# Patient Record
Sex: Female | Born: 1995 | Hispanic: No | Marital: Single | Smoking: Never smoker
Health system: Southern US, Community
[De-identification: ages and names within clinical notes are randomized; demographics above are authoritative.]

## PROBLEM LIST (undated history)

## (undated) HISTORY — PX: APPENDECTOMY: SHX54

## (undated) HISTORY — PX: HERNIA REPAIR: SHX51

---

## 2014-04-14 ENCOUNTER — Encounter (HOSPITAL_COMMUNITY): Payer: Self-pay | Admitting: Emergency Medicine

## 2014-04-14 ENCOUNTER — Emergency Department (HOSPITAL_COMMUNITY)
Admission: EM | Admit: 2014-04-14 | Discharge: 2014-04-15 | Disposition: A | Discharge status: Home / Self Care | Payer: Federal, State, Local not specified - PPO | Attending: Emergency Medicine | Admitting: Emergency Medicine

## 2014-04-14 ENCOUNTER — Emergency Department (HOSPITAL_COMMUNITY)
Admission: EM | Admit: 2014-04-14 | Discharge: 2014-04-14 | Disposition: A | Discharge status: Home / Self Care | Payer: Federal, State, Local not specified - PPO | Source: Home / Self Care | Attending: Family Medicine | Admitting: Family Medicine

## 2014-04-14 DIAGNOSIS — T50995A Adverse effect of other drugs, medicaments and biological substances, initial encounter: Secondary | ICD-10-CM | POA: Insufficient documentation

## 2014-04-14 DIAGNOSIS — N390 Urinary tract infection, site not specified: Secondary | ICD-10-CM

## 2014-04-14 DIAGNOSIS — R0682 Tachypnea, not elsewhere classified: Secondary | ICD-10-CM | POA: Diagnosis not present

## 2014-04-14 DIAGNOSIS — R0789 Other chest pain: Secondary | ICD-10-CM | POA: Diagnosis not present

## 2014-04-14 DIAGNOSIS — R000 Tachycardia, unspecified: Secondary | ICD-10-CM | POA: Diagnosis not present

## 2014-04-14 DIAGNOSIS — Z888 Allergy status to other drugs, medicaments and biological substances status: Secondary | ICD-10-CM | POA: Insufficient documentation

## 2014-04-14 DIAGNOSIS — J029 Acute pharyngitis, unspecified: Secondary | ICD-10-CM | POA: Insufficient documentation

## 2014-04-14 DIAGNOSIS — R252 Cramp and spasm: Secondary | ICD-10-CM | POA: Diagnosis not present

## 2014-04-14 DIAGNOSIS — Z880 Allergy status to penicillin: Secondary | ICD-10-CM | POA: Diagnosis not present

## 2014-04-14 DIAGNOSIS — F411 Generalized anxiety disorder: Secondary | ICD-10-CM | POA: Diagnosis not present

## 2014-04-14 DIAGNOSIS — T7840XA Allergy, unspecified, initial encounter: Secondary | ICD-10-CM

## 2014-04-14 LAB — POCT URINALYSIS DIP (DEVICE)
GLUCOSE, UA: 500 mg/dL — AB
Ketones, ur: 15 mg/dL — AB
Nitrite: POSITIVE — AB
Specific Gravity, Urine: 1.01 (ref 1.005–1.030)
Urobilinogen, UA: >=8 mg/dL (ref 0.0–1.0)
pH: 5 (ref 5.0–8.0)

## 2014-04-14 LAB — BASIC METABOLIC PANEL
Anion gap: 13 (ref 5–15)
BUN: 13 mg/dL (ref 6–23)
CO2: 26 mEq/L (ref 19–32)
Calcium: 9.3 mg/dL (ref 8.4–10.5)
Chloride: 101 mEq/L (ref 96–112)
Creatinine, Ser: 0.74 mg/dL (ref 0.50–1.10)
GFR calc Af Amer: >90 mL/min (ref >90)
GFR calc non Af Amer: >90 mL/min (ref >90)
Glucose, Bld: 100 mg/dL — ABNORMAL HIGH (ref 70–99)
Potassium: 3.4 mEq/L — ABNORMAL LOW (ref 3.7–5.3)
Sodium: 140 mEq/L (ref 137–147)

## 2014-04-14 LAB — CBC
HCT: 39.6 % (ref 36.0–46.0)
Hemoglobin: 13.8 g/dL (ref 12.0–15.0)
MCH: 32 pg (ref 26.0–34.0)
MCHC: 34.8 g/dL (ref 30.0–36.0)
MCV: 91.9 fL (ref 78.0–100.0)
Platelets: 189 10*3/uL (ref 150–400)
RBC: 4.31 MIL/uL (ref 3.87–5.11)
RDW: 11.7 % (ref 11.5–15.5)
WBC: 8.3 10*3/uL (ref 4.0–10.5)

## 2014-04-14 LAB — POCT PREGNANCY, URINE: Preg Test, Ur: NEGATIVE

## 2014-04-14 MED ORDER — PREDNISONE 20 MG PO TABS
60.0000 mg | ORAL_TABLET | Freq: Once | ORAL | Status: AC
  Administered 2014-04-14: 60 mg via ORAL
  Filled 2014-04-14: qty 3

## 2014-04-14 MED ORDER — DEXTROSE 5 % IV SOLN
1.0000 g | Freq: Once | INTRAVENOUS | Status: AC
  Administered 2014-04-14: 1 g via INTRAVENOUS
  Filled 2014-04-14: qty 10

## 2014-04-14 MED ORDER — DIPHENHYDRAMINE HCL 50 MG/ML IJ SOLN: 25.0000 mg | Freq: Once | INTRAMUSCULAR | Status: DC

## 2014-04-14 MED ORDER — DIPHENHYDRAMINE HCL 25 MG PO CAPS
25.0000 mg | ORAL_CAPSULE | Freq: Once | ORAL | Status: AC
  Administered 2014-04-14: 25 mg via ORAL
  Filled 2014-04-14: qty 1

## 2014-04-14 MED ORDER — SULFAMETHOXAZOLE-TRIMETHOPRIM 800-160 MG PO TABS: 1.0000 | ORAL_TABLET | Freq: Two times a day (BID) | ORAL | Status: DC

## 2014-04-14 MED ORDER — ONDANSETRON HCL 4 MG/2ML IJ SOLN
4.0000 mg | Freq: Once | INTRAMUSCULAR | Status: AC
  Administered 2014-04-14: 4 mg via INTRAVENOUS
  Filled 2014-04-14: qty 2

## 2014-04-14 MED ORDER — KETOROLAC TROMETHAMINE 30 MG/ML IJ SOLN
30.0000 mg | Freq: Once | INTRAMUSCULAR | Status: AC
  Administered 2014-04-14: 30 mg via INTRAVENOUS
  Filled 2014-04-14: qty 1

## 2014-04-14 MED ORDER — SODIUM CHLORIDE 0.9 % IV BOLUS (SEPSIS)
1000.0000 mL | Freq: Once | INTRAVENOUS | Status: AC
  Administered 2014-04-14: 1000 mL via INTRAVENOUS

## 2014-04-14 MED ORDER — ONDANSETRON 4 MG PO TBDP: 4.0000 mg | ORAL_TABLET | Freq: Once | ORAL | Status: DC

## 2014-04-14 MED ORDER — ACETAMINOPHEN 500 MG PO TABS
1000.0000 mg | ORAL_TABLET | Freq: Once | ORAL | Status: AC
  Administered 2014-04-14: 1000 mg via ORAL
  Filled 2014-04-14: qty 2

## 2014-04-14 NOTE — ED Provider Notes (Signed)
Medical screening examination/treatment/procedure(s) were performed by resident physician or non-physician practitioner and as supervising physician I was immediately available for consultation/collaboration.   KINDL,JAMES DOUGLAS MD.   James D Kindl, MD 04/14/14 1709 

## 2014-04-14 NOTE — ED Notes (Signed)
Pt went to [facility] for UTI and was given Bactrim for treatment.  Pt reports she is allergic to Bactrim but "forgot to tell them".  Pt took first dose of medication at 8pm tonight- currently complaining of chest tightness, difficulty breathing, difficulty swallowing, and muscle cramping.  Pt speaking in full sentences at present, airway intact- respirations even and unlabored at present.

## 2014-04-14 NOTE — ED Provider Notes (Signed)
CSN: [id number]     Arrival date & time 04/14/14  2212 History   First MD Initiated Contact with Patient 04/14/14 2301     Chief Complaint  Patient presents with  . Allergic Reaction     (Consider location/radiation/quality/duration/timing/severity/associated sxs/prior Treatment) HPI Pt is a 18yo female presenting to ED with c/o allergic reaction to bactrim she took around 8pm this evening after being dx with a UTI earlier today at urgent care. Pt states she woke this morning with urinary frequency, urgency, and dysuria w/o hematuria.  States she has had UTIs in past and states "I know what they feel like"  Reports mild nausea but no vomiting. States she forgot to tell urgent care she is allergic to bactrim.  Since taking medication she has developed chills, bilateral lower back pain, feels like her tongue and throat are swelling as well as having difficulty swallowing with chest tightness.  Lower back pain is cramping, 5/10, constant since onset a little after 8pm this evening.  In triage, pt is speaking in full sentences, respirations were even and unlabored.  Denies hives or rash.  Denies cough, congestion, vaginal symptoms, or symptoms prior to this morning. Reports taking Azogantrisin at home which does help minimally with dysuria.  Denies hx of renal stones.   History reviewed. No pertinent past medical history. History reviewed. No pertinent past surgical history. No family history on file. History  Substance Use Topics  . Smoking status: Never Smoker   . Smokeless tobacco: Not on file  . Alcohol Use: No   OB History   Grav Para Term Preterm Abortions TAB SAB Ect Mult Living                 Review of Systems  Constitutional: Positive for fever and chills. Negative for appetite change.  HENT: Positive for sore throat ( feels like her throat and tongue are swelling) and trouble swallowing. Negative for drooling and voice change.   Respiratory: Positive for chest tightness and  shortness of breath. Negative for cough.   Cardiovascular: Negative for chest pain and palpitations.  Gastrointestinal: Positive for nausea. Negative for vomiting, abdominal pain and diarrhea.  Genitourinary: Positive for dysuria, urgency and frequency. Negative for hematuria and flank pain.  Musculoskeletal: Positive for back pain ( lower) and myalgias.  All other systems reviewed and are negative.     Allergies  Bactrim and Penicillins  Home Medications   Prior to Admission medications   Medication Sig Start Date End Date Taking? Authorizing Provider  phenazopyridine (PYRIDIUM) 95 MG tablet Take 190 mg by mouth 3 (three) times daily as needed for pain.   Yes Historical Provider, MD  sulfamethoxazole-trimethoprim (SEPTRA DS) 800-160 MG per tablet Take 1 tablet by mouth 2 (two) times daily. 04/14/14  Yes Roma Kayser Schorr, NP  cephALEXin (KEFLEX) 500 MG capsule Take 1 capsule (500 mg total) by mouth 2 (two) times daily. 04/15/14   Junius Finner, PA-C  ondansetron (ZOFRAN) 4 MG tablet Take 1 tablet (4 mg total) by mouth every 6 (six) hours. 04/15/14   Junius Finner, PA-C   BP 95/52  Pulse 72  Temp(Src) 100.1 F (37.8 C) (Oral)  Resp 16  Ht  (1.6 m)  Wt 130 lb (58.968 kg)  BMI 23.03 kg/m2  SpO2 100%  LMP 03/31/2014 Physical Exam  Nursing note and vitals reviewed. Constitutional: She appears well-developed and well-nourished.  Pt lying in exam bed, shaking with chills, appears acutely ill  HENT:  Head: Normocephalic and  atraumatic.  Nose: Nose normal.  Mouth/Throat: Uvula is midline, oropharynx is clear and moist and mucous membranes are normal. No uvula swelling.  Oropharynx clear, no angio edema, tongue swelling to tonsillar edema. Airway in tact.  Eyes: Conjunctivae are normal. No scleral icterus.  Neck: Normal range of motion. Neck supple.  Cardiovascular: Regular rhythm and normal heart sounds.  Tachycardia present.   Pt tachycardic during physical exam but was not in  triage.   Pulmonary/Chest: Breath sounds normal. Tachypnea noted. No respiratory distress. She has no wheezes. She has no rales. She exhibits no tenderness.  tachypneic during exam but not in triage. No respiratory distress. Able to speak in full sentences, no wheeze.  Lungs: CTAB.  Abdominal: Soft. Bowel sounds are normal. She exhibits no distension and no mass. There is no tenderness. There is no rebound and no guarding.  Soft, non-distended, non-tender. No CVAT  Musculoskeletal: Normal range of motion.  Neurological: She is alert.  Skin: Skin is warm and dry.  Psychiatric: Her mood appears anxious.    ED Course  Procedures (including critical care time) Labs Review Labs Reviewed  BASIC METABOLIC PANEL - Abnormal; Notable for the following:    Potassium 3.4 (*)    Glucose, Bld 100 (*)    All other components within normal limits  URINALYSIS, ROUTINE W REFLEX MICROSCOPIC - Abnormal; Notable for the following:    Color, Urine ORANGE (*)    APPearance CLOUDY (*)    Ketones, ur 15 (*)    Nitrite POSITIVE (*)    Leukocytes, UA SMALL (*)    All other components within normal limits  URINE CULTURE  CBC  TROPONIN I  URINE MICROSCOPIC-ADD ON  I-STAT CG4 LACTIC ACID, ED    Imaging Review No results found.   EKG Interpretation   Date/Time:  Sunday April 15 2014 01:29:44 EDT Ventricular Rate:  108 PR Interval:  125 QRS Duration: 99 QT Interval:  329 QTC Calculation: 441 R Axis:   87 Text Interpretation:  Sinus tachycardia Borderline Q waves in lateral  leads Borderline repolarization abnormality T wave inversion in the  inferior and lateral leads - unchanged from today's prior EKG Confirmed by  Rhunette Croft, MD, ANKIT [phone]) on 04/15/2014 3:57:19 AM      MDM   Final diagnoses:  Allergic reaction, initial encounter  Chest tightness  Muscle cramps  UTI (lower urinary tract infection)   Pt is a 18yo female dx with UTI this morning at urgent care, presenting to ED with  c/o allergic reaction to bactrim she took at [facility] for her UTI.  C/o chest tightness, throat swelling, and lower back muscle cramping.  Pt does not appear to be in respiratory distress, O2 is 99% on room air, lungs: CTAB.  Pt is shaking with chills. No evidence of hives. No angioedema or tongue swelling appreciated on exam.  Pt given PO benadryl and prednisone.  HR in triage was 88, however, once pt in exam room, HR increased to 113, resp 28, pt started on IV fluids, given zofran and toradol.    11:31 PM pt states swelling has improved after given prednisone, benadryl, zofran and IV fluids started.    12:30 AM BP dropped to 85/47 after given 1L IV fluids. Pt states her breathing feels better but she still has mild chest tightness with deep inspiration, otherwise, feels well.  Discussed pt with Dr. Rhunette Croft who also examined pt, will give another 1L IV fluids, if pressure does not improve, will give IM Epi.  Will also obtain UA for culture as UTI appears complicated at this point.  IV rocephin given while pt in ED.    BP did improve, pt states she is feeling better and has been asking for crackers and something to drink. Pt was able to keep down several ounces of PO fluids and crackers.  Pt was observed in ED w/o evidence of evolving allergic reaction such as anaphylaxis.     Pt may be discharged home with 10 day course of keflex and zofran. Return precautions provided. Pt verbalized understanding and agreement with tx plan.  Discussed pt with Dr. Rhunette Croft throughout her ED stay.   Junius Finner, PA-C 04/15/14 2009

## 2014-04-14 NOTE — ED Provider Notes (Signed)
CSN: [id number]     Arrival date & time 04/14/14  1253 History   First MD Initiated Contact with Patient 04/14/14 1354     Chief Complaint  Patient presents with  . Urinary Tract Infection   Patient is a 18 y.o. female presenting with urinary tract infection. The history is provided by the patient.  Urinary Tract Infection This is a new problem. The current episode started 12 to 24 hours ago. The problem occurs constantly. The problem has been gradually worsening. Pertinent negatives include no chest pain, no abdominal pain, no headaches and no shortness of breath.  Pt reports frequency, urgency and dysuria since she awoke this am. She has had UTI's in thwe past and sx's are similar. Pt was able to palliate her discomfort with OTC Azogantrisin. Denies fever, abd or flank pain.   History reviewed. No pertinent past medical history. History reviewed. No pertinent past surgical history. No family history on file. History  Substance Use Topics  . Smoking status: Never Smoker   . Smokeless tobacco: Not on file  . Alcohol Use: No   OB History   Grav Para Term Preterm Abortions TAB SAB Ect Mult Living                 Review of Systems  Respiratory: Negative for shortness of breath.   Cardiovascular: Negative for chest pain.  Gastrointestinal: Negative for abdominal pain.  Neurological: Negative for headaches.  All other systems reviewed and are negative.   Allergies  Review of patient's allergies indicates no known allergies.  Home Medications   Prior to Admission medications   Medication Sig Start Date End Date Taking? Authorizing Provider  sulfamethoxazole-trimethoprim (SEPTRA DS) 800-160 MG per tablet Take 1 tablet by mouth 2 (two) times daily. 04/14/14   Roma Kayser Kienan Doublin, NP   BP 115/50  Pulse 88  Temp(Src) 97.9 F (36.6 C) (Oral)  Resp 18  SpO2 99%  LMP 03/31/2014 Physical Exam  Constitutional: She is oriented to person, place, and time. She appears well-developed and  well-nourished.  HENT:  Head: Normocephalic and atraumatic.  Eyes: Conjunctivae are normal.  Cardiovascular: Normal rate.   Pulmonary/Chest: Effort normal.  Abdominal: Soft. She exhibits no distension. There is no CVA tenderness.  Musculoskeletal: Normal range of motion.  Neurological: She is alert and oriented to person, place, and time.  Skin: Skin is warm and dry.  Psychiatric: She has a normal mood and affect.    ED Course  Procedures (including critical care time) Labs Review Labs Reviewed  POCT URINALYSIS DIP (DEVICE) - Abnormal; Notable for the following:    Glucose, UA 500 (*)    Bilirubin Urine MODERATE (*)    Ketones, ur 15 (*)    Hgb urine dipstick LARGE (*)    Protein, ur >=300 (*)    Nitrite POSITIVE (*)    Leukocytes, UA LARGE (*)    All other components within normal limits  POCT PREGNANCY, URINE    Imaging Review No results found.   MDM   1. Acute UTI    Septra DS II PO BID x 5 days Continue Azo-gantrisin as needed for dysuria.    Leanne Chang, NP 04/14/14 1425

## 2014-04-14 NOTE — Discharge Instructions (Signed)

## 2014-04-14 NOTE — ED Notes (Signed)
C/o UTI sx onset today Sx include: urinary freq/urgency, dysuria, hematuria Denies f/v, chills, abd pain Taking OTC Azo's w/no relief Alert, no signs of acute distress.

## 2014-04-15 LAB — URINALYSIS, ROUTINE W REFLEX MICROSCOPIC
Bilirubin Urine: NEGATIVE
Glucose, UA: NEGATIVE mg/dL
Hgb urine dipstick: NEGATIVE
Ketones, ur: 15 mg/dL — AB
Nitrite: POSITIVE — AB
Protein, ur: NEGATIVE mg/dL
Specific Gravity, Urine: 1.017 (ref 1.005–1.030)
Urobilinogen, UA: 1 mg/dL (ref 0.0–1.0)
pH: 6.5 (ref 5.0–8.0)

## 2014-04-15 LAB — TROPONIN I: Troponin I: <0.3 ng/mL (ref <0.30)

## 2014-04-15 LAB — URINE MICROSCOPIC-ADD ON

## 2014-04-15 LAB — I-STAT CG4 LACTIC ACID, ED: Lactic Acid, Venous: 1.81 mmol/L (ref 0.5–2.2)

## 2014-04-15 MED ORDER — SODIUM CHLORIDE 0.9 % IV BOLUS (SEPSIS)
1000.0000 mL | Freq: Once | INTRAVENOUS | Status: AC
  Administered 2014-04-15: 1000 mL via INTRAVENOUS

## 2014-04-15 MED ORDER — ONDANSETRON HCL 4 MG PO TABS: 4.0000 mg | ORAL_TABLET | Freq: Four times a day (QID) | ORAL | Status: DC

## 2014-04-15 MED ORDER — CEPHALEXIN 500 MG PO CAPS: 500.0000 mg | ORAL_CAPSULE | Freq: Two times a day (BID) | ORAL | Status: DC

## 2014-04-15 NOTE — ED Notes (Signed)
Dr. Rhunette Croft and Denny Peon, PA-C at bedside due to patients drop in blood pressure.

## 2014-04-16 LAB — URINE CULTURE
Colony Count: NO GROWTH
Culture: NO GROWTH

## 2014-04-16 NOTE — ED Provider Notes (Signed)
Medical screening examination/treatment/procedure(s) were conducted as a shared visit with non-physician practitioner(s) and myself.  I personally evaluated the patient during the encounter.   EKG Interpretation   Date/Time:  Sunday April 15 2014 01:29:44 EDT Ventricular Rate:  108 PR Interval:  125 QRS Duration: 99 QT Interval:  329 QTC Calculation: 441 R Axis:   87 Text Interpretation:  Sinus tachycardia Borderline Q waves in lateral  leads Borderline repolarization abnormality T wave inversion in the  inferior and lateral leads - unchanged from today's prior EKG Confirmed by  Rhunette Croft, MD, Janey Genta [phone]) on 04/15/2014 3:57:19 AM      Pt comes in with allergic reaction to bactrim She has hx of allergy to it, but forgot about it, and took the pill this evening (UTI).She started having some chest tightness, and discomfort in her throat and difficulty swallowing. Pt given some allergy meds in the ER - and feels better. She has no stridor, drooling, oral mucosa swelling and no wheez. Pt had her BP in the 80s -  Which i think is just normal for her, but given the bactrim intake, we were aggressive and gave her iv fluid and monitored her closely. PO challenge passed. Switch to keflex and nausea meds given.  Derwood Kaplan, MD 04/16/14 0005

## 2014-05-25 ENCOUNTER — Encounter (HOSPITAL_COMMUNITY): Payer: Self-pay | Admitting: Emergency Medicine

## 2014-05-25 ENCOUNTER — Emergency Department (HOSPITAL_COMMUNITY)
Admission: EM | Admit: 2014-05-25 | Discharge: 2014-05-26 | Disposition: A | Discharge status: Home / Self Care | Payer: Federal, State, Local not specified - PPO | Attending: Emergency Medicine | Admitting: Emergency Medicine

## 2014-05-25 ENCOUNTER — Emergency Department (HOSPITAL_COMMUNITY): Payer: Federal, State, Local not specified - PPO

## 2014-05-25 DIAGNOSIS — Z880 Allergy status to penicillin: Secondary | ICD-10-CM | POA: Diagnosis not present

## 2014-05-25 DIAGNOSIS — S99921A Unspecified injury of right foot, initial encounter: Secondary | ICD-10-CM | POA: Diagnosis present

## 2014-05-25 DIAGNOSIS — Z23 Encounter for immunization: Secondary | ICD-10-CM | POA: Insufficient documentation

## 2014-05-25 DIAGNOSIS — Z792 Long term (current) use of antibiotics: Secondary | ICD-10-CM | POA: Diagnosis not present

## 2014-05-25 DIAGNOSIS — R52 Pain, unspecified: Secondary | ICD-10-CM

## 2014-05-25 DIAGNOSIS — Z79899 Other long term (current) drug therapy: Secondary | ICD-10-CM | POA: Insufficient documentation

## 2014-05-25 DIAGNOSIS — Y9289 Other specified places as the place of occurrence of the external cause: Secondary | ICD-10-CM | POA: Diagnosis not present

## 2014-05-25 DIAGNOSIS — Y9389 Activity, other specified: Secondary | ICD-10-CM | POA: Insufficient documentation

## 2014-05-25 DIAGNOSIS — S9031XA Contusion of right foot, initial encounter: Secondary | ICD-10-CM | POA: Diagnosis not present

## 2014-05-25 DIAGNOSIS — W500XXA Accidental hit or strike by another person, initial encounter: Secondary | ICD-10-CM | POA: Diagnosis not present

## 2014-05-25 MED ORDER — TETANUS-DIPHTH-ACELL PERTUSSIS 5-2.5-18.5 LF-MCG/0.5 IM SUSP
0.5000 mL | Freq: Once | INTRAMUSCULAR | Status: AC
  Administered 2014-05-26: 0.5 mL via INTRAMUSCULAR
  Filled 2014-05-25: qty 0.5
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
## 2014-05-26 MED ORDER — IBUPROFEN 800 MG PO TABS: 800.0000 mg | ORAL_TABLET | Freq: Three times a day (TID) | ORAL | Status: AC | PRN

## 2014-05-26 NOTE — ED Provider Notes (Signed)
Medical screening examination/treatment/procedure(s) were performed by non-physician practitioner and as supervising physician I was immediately available for consultation/collaboration.   EKG Interpretation None       Ivie Savitt M Collen Vincent, MD 05/26/14 0639 

## 2014-05-26 NOTE — Discharge Instructions (Signed)
Read the information below.  Use the prescribed medication as directed.  Please discuss all new medications with your pharmacist.  You may return to the Emergency Department at any time for worsening condition or any new symptoms that concern you.    If you develop uncontrolled pain, weakness or numbness of the extremity, severe discoloration of the skin, or you are unable to walk, return to the ER for a recheck.      Contusion A contusion is a deep bruise. Contusions happen when an injury causes bleeding under the skin. Signs of bruising include pain, puffiness (swelling), and discolored skin. The contusion may turn blue, purple, or yellow. HOME CARE   Put ice on the injured area.  Put ice in a plastic bag.  Place a towel between your skin and the bag.  Leave the ice on for 15-20 minutes, 03-04 times a day.  Only take medicine as told by your doctor.  Rest the injured area.  If possible, raise (elevate) the injured area to lessen puffiness. GET HELP RIGHT AWAY IF:   You have more bruising or puffiness.  You have pain that is getting worse.  Your puffiness or pain is not helped by medicine. MAKE SURE YOU:   Understand these instructions.  Will watch your condition.  Will get help right away if you are not doing well or get worse. Document Released: 12/30/2007 Document Revised: 10/05/2011 Document Reviewed: 05/18/2011 [facility] Patient Information 2015 [employer] [place]. This information is not intended to replace advice given to you by your health care provider. Make sure you discuss any questions you have with your health care provider.

## 2015-04-09 IMAGING — CR DG FOOT COMPLETE 3+V*R*
3 series · 3 of 3 positions shown · non-contrast
Comparison: None.

CLINICAL DATA: RIGHT foot was stepped on yesterday, bruising across
mid and forefoot.

EXAM:
RIGHT FOOT COMPLETE - 3+ VIEW

[t foot ap right]
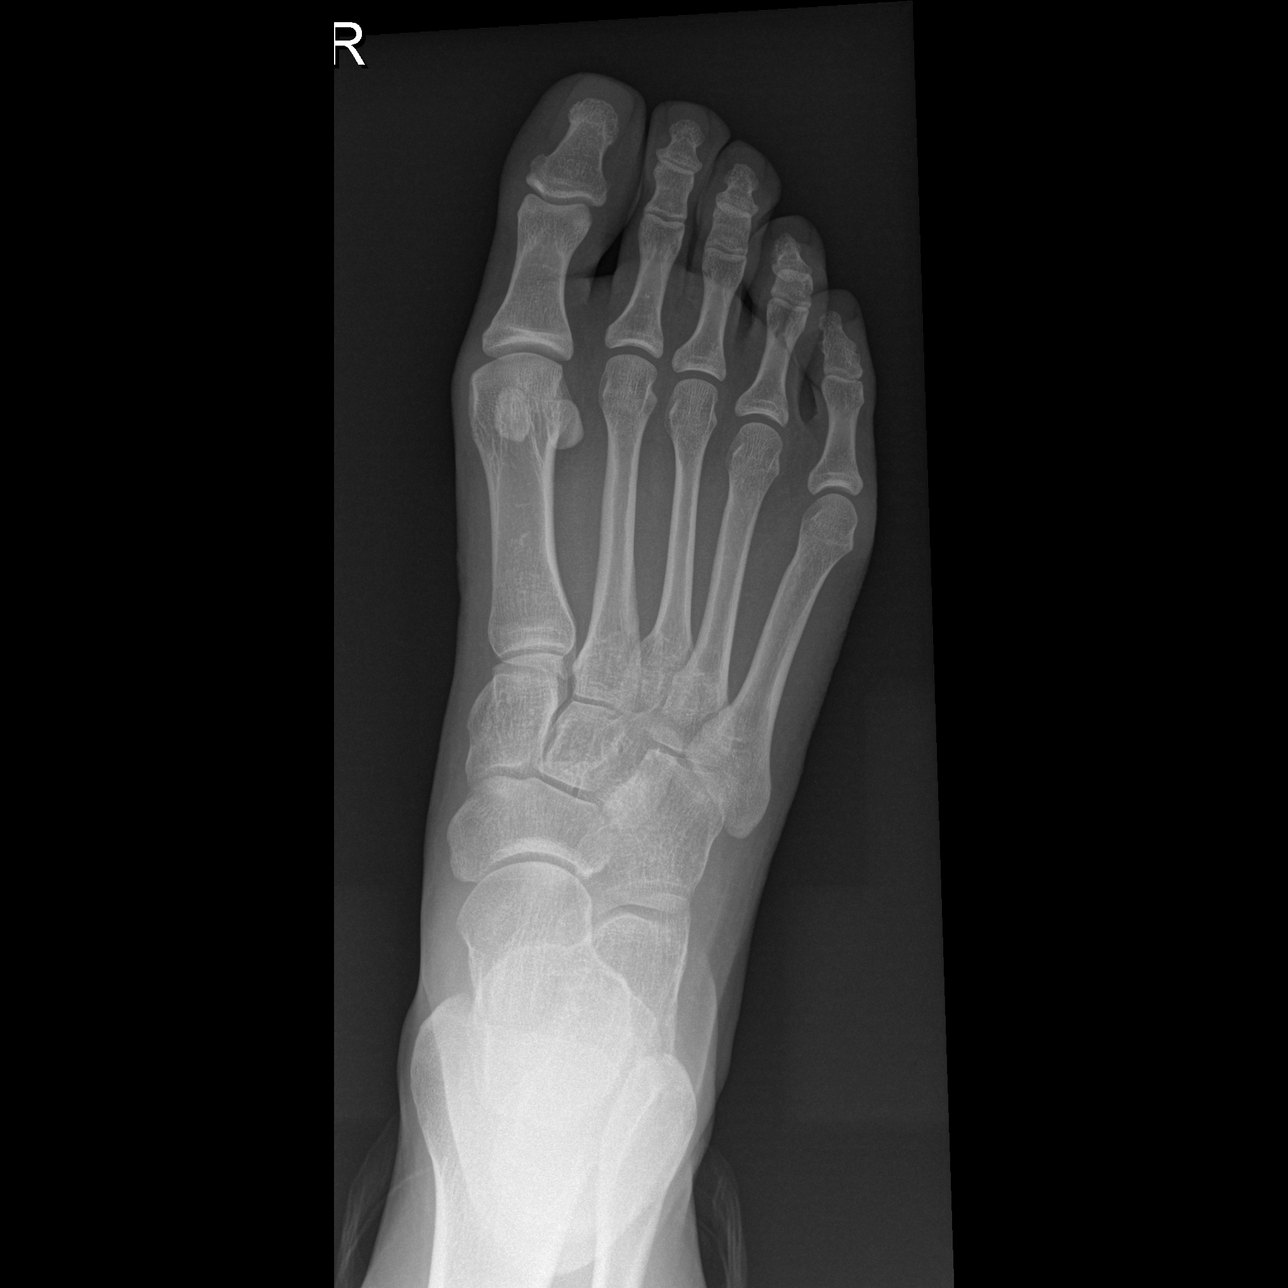

[t foot oblique right]
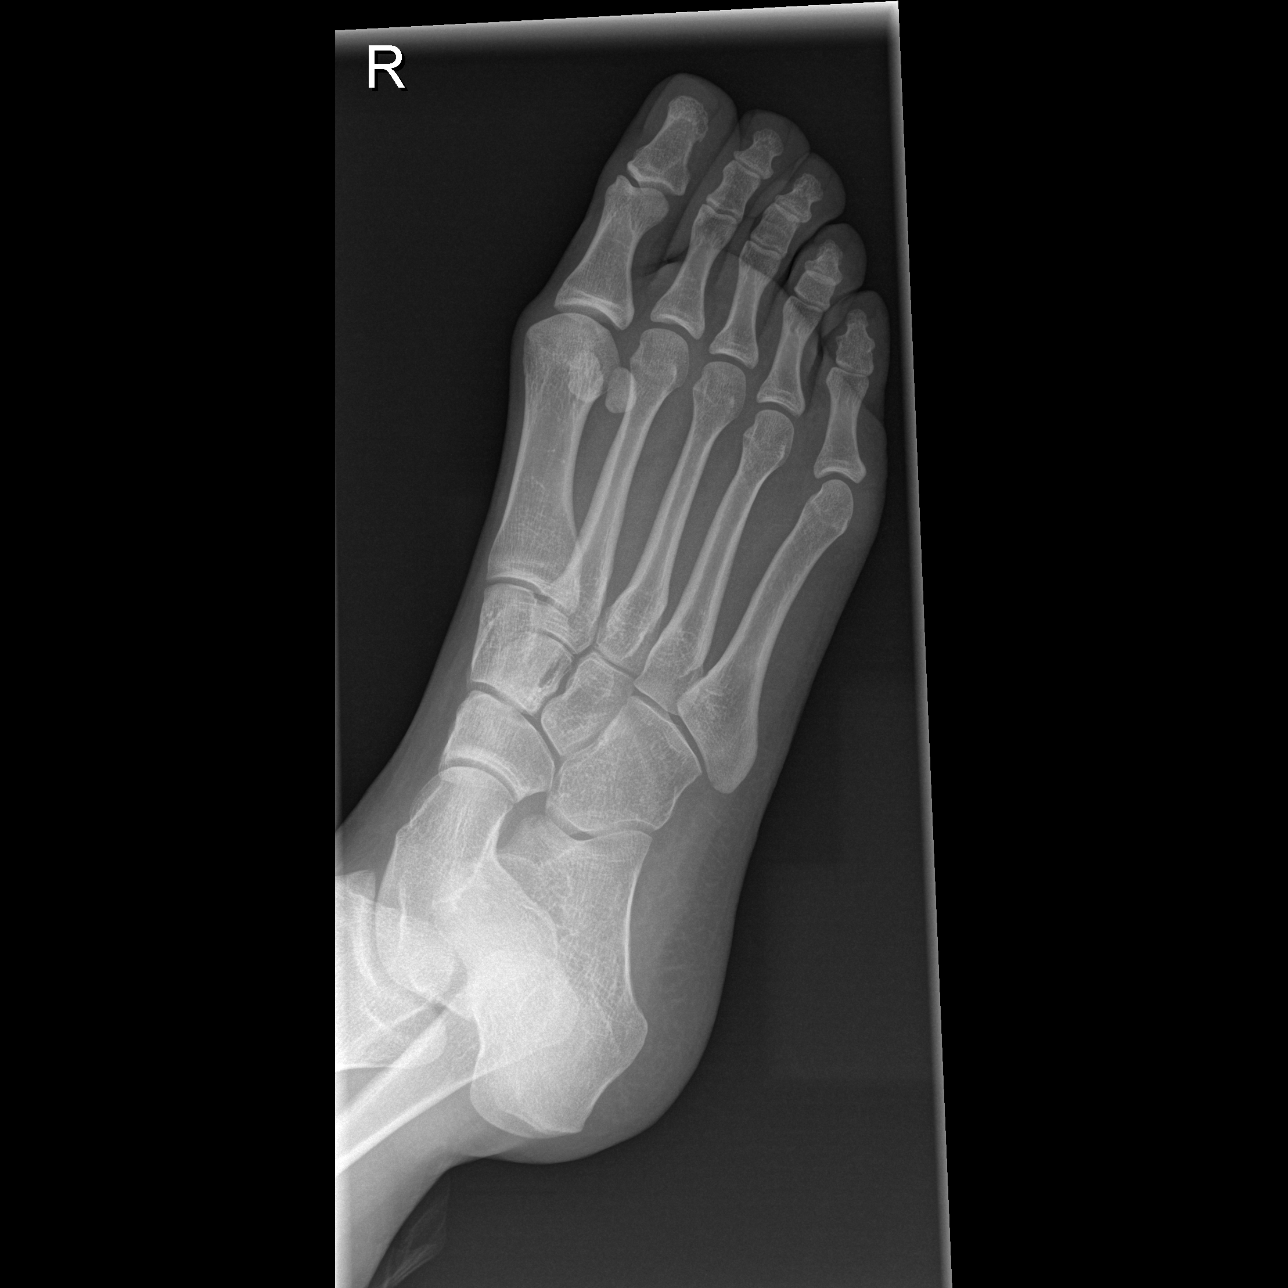

[t foot lat right]
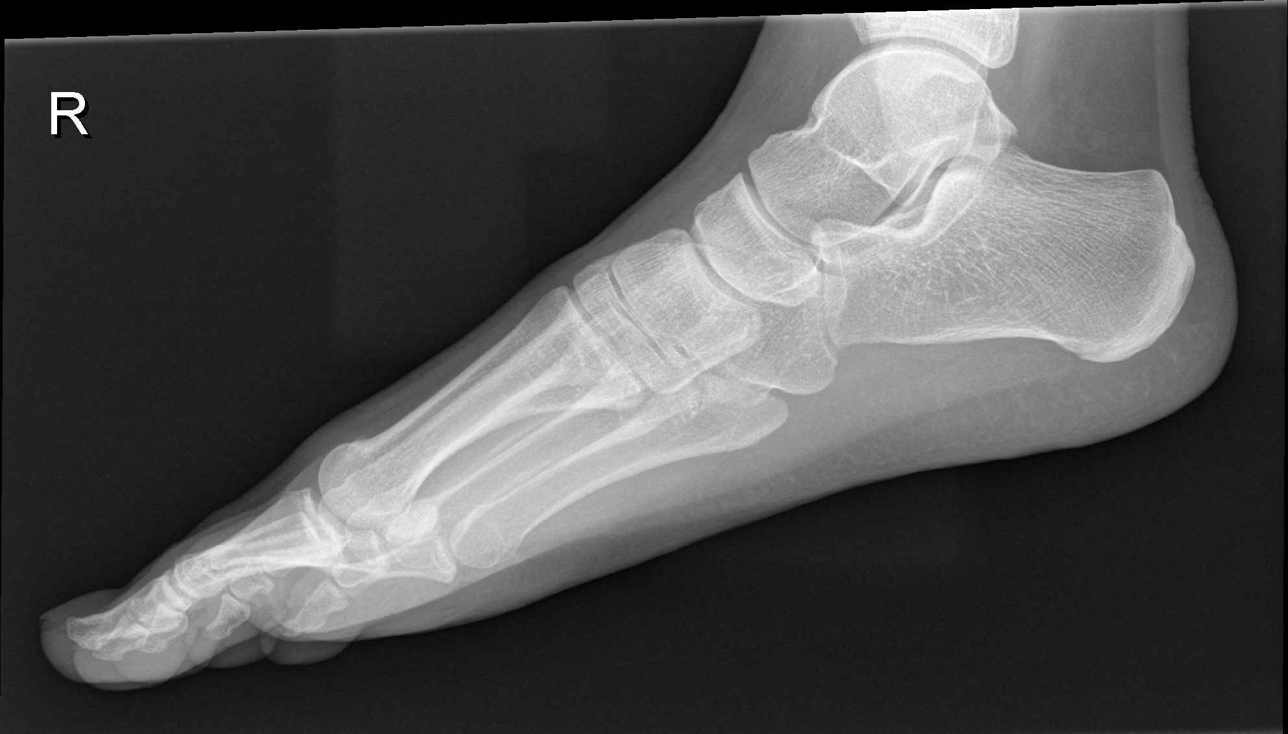

[3 of 3 positions shown; findings below may reference images not displayed]

FINDINGS: There is no evidence of fracture or dislocation. There is no
evidence of arthropathy or other focal bone abnormality. Soft
tissues are unremarkable.
IMPRESSION: Negative.

  By: Gaston Ezequiel Mouslim

## 2015-11-18 ENCOUNTER — Encounter (HOSPITAL_COMMUNITY): Payer: Self-pay | Admitting: Emergency Medicine

## 2015-11-18 ENCOUNTER — Ambulatory Visit (HOSPITAL_COMMUNITY)
Admission: EM | Admit: 2015-11-18 | Discharge: 2015-11-18 | Disposition: A | Discharge status: Home / Self Care | Payer: Federal, State, Local not specified - PPO | Attending: Family Medicine | Admitting: Family Medicine

## 2015-11-18 DIAGNOSIS — Z880 Allergy status to penicillin: Secondary | ICD-10-CM | POA: Insufficient documentation

## 2015-11-18 DIAGNOSIS — Z882 Allergy status to sulfonamides status: Secondary | ICD-10-CM | POA: Diagnosis not present

## 2015-11-18 DIAGNOSIS — Z881 Allergy status to other antibiotic agents status: Secondary | ICD-10-CM | POA: Insufficient documentation

## 2015-11-18 DIAGNOSIS — N390 Urinary tract infection, site not specified: Secondary | ICD-10-CM | POA: Diagnosis not present

## 2015-11-18 LAB — POCT URINALYSIS DIP (DEVICE)
GLUCOSE, UA: 250 mg/dL — AB
NITRITE: POSITIVE — AB
Protein, ur: >=300 mg/dL — AB
Specific Gravity, Urine: 1.015 (ref 1.005–1.030)
Urobilinogen, UA: >=8 mg/dL (ref 0.0–1.0)
pH: 5.5 (ref 5.0–8.0)

## 2015-11-18 MED ORDER — NITROFURANTOIN MONOHYD MACRO 100 MG PO CAPS: 100.0000 mg | ORAL_CAPSULE | Freq: Two times a day (BID) | ORAL | Status: DC

## 2015-11-18 NOTE — ED Notes (Signed)
Reports symptoms started 3 days ago.  Patient reports feeling the need to urinate, burning, pressure with urination.  History of the same

## 2015-11-18 NOTE — Discharge Instructions (Signed)
Take all of medicine as directed, drink lots of fluids, see your doctor if further problems. °

## 2015-11-18 NOTE — ED Provider Notes (Signed)
CSN: [id number]     Arrival date & time 11/18/15  1511 History   First MD Initiated Contact with Patient 11/18/15 1625     Chief Complaint  Patient presents with  . Urinary Tract Infection   (Consider location/radiation/quality/duration/timing/severity/associated sxs/prior Treatment) Patient is a 20 y.o. female presenting with urinary tract infection. The history is provided by the patient.  Urinary Tract Infection Pain quality:  Burning Pain severity:  Moderate Onset quality:  Gradual Duration:  3 days Progression:  Unchanged Chronicity:  New Recent urinary tract infections: yes   Ineffective treatments:  Cranberry juice and phenazopyridine Urinary symptoms: discolored urine and frequent urination   Associated symptoms: no fever, no flank pain, no nausea and no vomiting     History reviewed. No pertinent past medical history. Past Surgical History  Procedure Laterality Date  . Appendectomy    . Hernia repair     No family history on file. Social History  Substance Use Topics  . Smoking status: Never Smoker   . Smokeless tobacco: None  . Alcohol Use: No   OB History    No data available     Review of Systems  Constitutional: Negative.  Negative for fever.  Gastrointestinal: Negative.  Negative for nausea and vomiting.  Genitourinary: Positive for dysuria, urgency and frequency. Negative for flank pain, menstrual problem and pelvic pain.  All other systems reviewed and are negative.   Allergies  Bactrim; Ceftin; Penicillins; and Septra  Home Medications   Prior to Admission medications   Medication Sig Start Date End Date Taking? Authorizing Provider  cephALEXin (KEFLEX) 500 MG capsule Take 1 capsule (500 mg total) by mouth 2 (two) times daily. Patient not taking: Reported on 11/18/2015 04/15/14   Junius FinnerErin O'Malley, PA-C  ibuprofen (ADVIL,MOTRIN) 800 MG tablet Take 1 tablet (800 mg total) by mouth every 8 (eight) hours as needed for mild pain or moderate pain. 05/26/14    Trixie DredgeEmily West, PA-C  ondansetron (ZOFRAN) 4 MG tablet Take 1 tablet (4 mg total) by mouth every 6 (six) hours. 04/15/14   Junius FinnerErin O'Malley, PA-C  phenazopyridine (PYRIDIUM) 95 MG tablet Take 190 mg by mouth 3 (three) times daily as needed for pain.    Historical Provider, MD  sulfamethoxazole-trimethoprim (SEPTRA DS) 800-160 MG per tablet Take 1 tablet by mouth 2 (two) times daily. Patient not taking: Reported on 11/18/2015 04/14/14   Leanne ChangKatherine P Schorr, NP   Meds Ordered and Administered this Visit  Medications - No data to display  BP 110/62 mmHg  Pulse 64  Temp(Src) 98.4 F (36.9 C) (Oral)  Resp 16  SpO2 100%  LMP 11/09/2015 No data found.   Physical Exam  Constitutional: She is oriented to person, place, and time. She appears well-developed and well-nourished. No distress.  Abdominal: Soft. Bowel sounds are normal. There is no tenderness.  Neurological: She is alert and oriented to person, place, and time.  Skin: Skin is warm and dry.  Nursing note and vitals reviewed.   ED Course  Procedures (including critical care time)  Labs Review Labs Reviewed  POCT URINALYSIS DIP (DEVICE) - Abnormal; Notable for the following:    Glucose, UA 250 (*)    Bilirubin Urine MODERATE (*)    Ketones, ur TRACE (*)    Hgb urine dipstick MODERATE (*)    Protein, ur >=300 (*)    Nitrite POSITIVE (*)    Leukocytes, UA LARGE (*)    All other components within normal limits    Imaging Review No  results found.   Visual Acuity Review  Right Eye Distance:   Left Eye Distance:   Bilateral Distance:    Right Eye Near:   Left Eye Near:    Bilateral Near:         MDM  No diagnosis found.  Meds ordered this encounter  Medications  . nitrofurantoin, macrocrystal-monohydrate, (MACROBID) 100 MG capsule    Sig: Take 1 capsule (100 mg total) by mouth 2 (two) times daily.    Dispense:  14 capsule    Refill:  0      Linna Hoff, MD 11/18/15 [phone]

## 2015-11-21 LAB — URINE CULTURE

## 2017-05-24 ENCOUNTER — Ambulatory Visit (INDEPENDENT_AMBULATORY_CARE_PROVIDER_SITE_OTHER): Payer: Self-pay | Admitting: Physician Assistant

## 2017-05-24 ENCOUNTER — Encounter: Payer: Self-pay | Admitting: Physician Assistant

## 2017-05-24 VITALS — BP 108/60 | HR 75 | Temp 98.8°F | Resp 16 | Ht 62.0 in | Wt 125.0 lb

## 2017-05-24 DIAGNOSIS — R300 Dysuria: Secondary | ICD-10-CM

## 2017-05-24 DIAGNOSIS — N3000 Acute cystitis without hematuria: Secondary | ICD-10-CM

## 2017-05-24 LAB — POCT URINALYSIS DIP (MANUAL ENTRY)
BILIRUBIN UA: NEGATIVE
BILIRUBIN UA: NEGATIVE mg/dL
Glucose, UA: 100 mg/dL — AB
Nitrite, UA: POSITIVE — AB
PH UA: 6.5 (ref 5.0–8.0)
Protein Ur, POC: 30 mg/dL — AB
Spec Grav, UA: 1.02 (ref 1.010–1.025)
UROBILINOGEN UA: 2 U/dL — AB

## 2017-05-24 MED ORDER — NITROFURANTOIN MONOHYD MACRO 100 MG PO CAPS: 100.0000 mg | ORAL_CAPSULE | Freq: Two times a day (BID) | ORAL | 0 refills | Status: AC

## 2017-05-24 NOTE — Patient Instructions (Addendum)
Urinary Tract Infection, Adult A urinary tract infection (UTI) is an infection of any part of the urinary tract. The urinary tract includes the:  Kidneys.  Ureters.  Bladder.  Urethra.  These organs make, store, and get rid of pee (urine) in the body. Follow these instructions at home:  Take over-the-counter and prescription medicines only as told by your doctor.  If you were prescribed an antibiotic medicine, take it as told by your doctor. Do not stop taking the antibiotic even if you start to feel better.  Avoid the following drinks: ? Alcohol. ? Caffeine. ? Tea. ? Carbonated drinks.  Drink enough fluid to keep your pee clear or pale yellow.  Keep all follow-up visits as told by your doctor. This is important.  Make sure to: ? Empty your bladder often and completely. Do not to hold pee for long periods of time. ? Empty your bladder before and after sex. ? Wipe from front to back after a bowel movement if you are female. Use each tissue one time when you wipe. Contact a doctor if:  You have back pain.  You have a fever.  You feel sick to your stomach (nauseous).  You throw up (vomit).  Your symptoms do not get better after 3 days.  Your symptoms go away and then come back. Get help right away if:  You have very bad back pain.  You have very bad lower belly (abdominal) pain.  You are throwing up and cannot keep down any medicines or water. This information is not intended to replace advice given to you by your health care provider. Make sure you discuss any questions you have with your health care provider. Document Released: 12/30/2007 Document Revised: 12/19/2015 Document Reviewed: 06/03/2015 Elsevier Interactive Patient Education  2018 [employer]     IF you received an x-ray today, you will receive an invoice from [facility]. Please contact [facility] at [phone] with questions or concerns regarding your invoice.   IF you  received labwork today, you will receive an invoice from [facility]. Please contact [facility] at [phone] with questions or concerns regarding your invoice.   Our billing staff will not be able to assist you with questions regarding bills from these companies.  You will be contacted with the lab results as soon as they are available. The fastest way to get your results is to activate your My Chart account. Instructions are located on the last page of this paperwork. If you have not heard from us regarding the results in 2 weeks, please contact this office.      

## 2017-05-24 NOTE — Progress Notes (Signed)
PRIMARY C[facility] AT [facility] [address], [place] [phone]  Date:  05/24/2017   Name:  Dorothy Robinson   DOB:  01/13/1996   MRN:  [id number]  PCP:  Patient, No Pcp Per    History of Present Illness:  Dorothy Robinson is a 21 y.o. female patient who presents to PCP with  Chief Complaint  Patient presents with  . Urinary Tract Infection     1 week of dysuria, urgency.  No frequency.  No abdominal pain, fever, nausea, or back pain Tried otc azo Sexually active protected.  She us urinating directly after most of the time. Denies any vaginal discharge.   There are no active problems to display for this patient.   No past medical history on file.  Past Surgical History:  Procedure Laterality Date  . APPENDECTOMY    . HERNIA REPAIR      Social History  Substance Use Topics  . Smoking status: Never Smoker  . Smokeless tobacco: Not on file  . Alcohol use No    No family history on file.  Allergies  Allergen Reactions  . Bactrim [Sulfamethoxazole-Trimethoprim]     Cramping, shortness of breath, dizzy, chills, shaking, hard to swallow, eye turn red  . Ceftin [Cefuroxime Axetil]   . Penicillins     Cramping, shortness of breath, dizzy, chills, shaking, hard to swallow  . Septra [Sulfamethoxazole-Trimethoprim]     Medication list has been reviewed and updated.  Current Outpatient Prescriptions on File Prior to Visit  Medication Sig Dispense Refill  . cephALEXin (KEFLEX) 500 MG capsule Take 1 capsule (500 mg total) by mouth 2 (two) times daily. (Patient not taking: Reported on 11/18/2015) 20 capsule 0  . ibuprofen (ADVIL,MOTRIN) 800 MG tablet Take 1 tablet (800 mg total) by mouth every 8 (eight) hours as needed for mild pain or moderate pain. 15 tablet 0  . nitrofurantoin, macrocrystal-monohydrate, (MACROBID) 100 MG capsule Take 1 capsule (100 mg total) by mouth 2 (two) times daily. 14 capsule 0  . ondansetron (ZOFRAN) 4 MG tablet Take 1  tablet (4 mg total) by mouth every 6 (six) hours. 12 tablet 0  . phenazopyridine (PYRIDIUM) 95 MG tablet Take 190 mg by mouth 3 (three) times daily as needed for pain.    Marland Kitchen. sulfamethoxazole-trimethoprim (SEPTRA DS) 800-160 MG per tablet Take 1 tablet by mouth 2 (two) times daily. (Patient not taking: Reported on 11/18/2015) 10 tablet 0   No current facility-administered medications on file prior to visit.     ROS ROS otherwise unremarkable unless listed above.  Physical Examination: There were no vitals taken for this visit. Ideal Body Weight:    Physical Exam  Constitutional: She is oriented to person, place, and time. She appears well-developed and well-nourished. No distress.  HENT:  Head: Normocephalic and atraumatic.  Right Ear: External ear normal.  Left Ear: External ear normal.  Eyes: Pupils are equal, round, and reactive to light. Conjunctivae and EOM are normal.  Cardiovascular: Normal rate and regular rhythm.   No murmur heard. Pulmonary/Chest: Effort normal. No respiratory distress.  Abdominal: Soft. Normal appearance and bowel sounds are normal. There is tenderness in the suprapubic area. There is no CVA tenderness.  Neurological: She is alert and oriented to person, place, and time.  Skin: She is not diaphoretic.  Psychiatric: She has a normal mood and affect. Her behavior is normal.   Results for orders placed or performed in visit on 05/24/17  POCT urinalysis dipstick  Result Value Ref Range   Color, UA orange (A) yellow   Clarity, UA cloudy (A) clear   Glucose, UA =100 (A) negative mg/dL   Bilirubin, UA negative negative   Ketones, POC UA negative negative mg/dL   Spec Grav, UA 1.610 9.604 - 1.025   Blood, UA small (A) negative   pH, UA 6.5 5.0 - 8.0   Protein Ur, POC =30 (A) negative mg/dL   Urobilinogen, UA 2.0 (A) 0.2 or 1.0 E.U./dL   Nitrite, UA Positive (A) Negative   Leukocytes, UA Large (3+) (A) Negative      Assessment and Plan: Dorothy Robinson is a 21 y.o. female who is here today for cc of  Chief Complaint  Patient presents with  . Urinary Tract Infection   Alarming sxs discussed to warrant an immediate return. Acute cystitis without hematuria - Plan: nitrofurantoin, macrocrystal-monohydrate, (MACROBID) 100 MG capsule  Dysuria - Plan: POCT urinalysis dipstick, Urine Culture  Trena Platt, PA-C [facility] 10/30/20189:09 AM

## 2017-05-26 LAB — URINE CULTURE

## 2017-06-30 ENCOUNTER — Encounter: Payer: Self-pay | Admitting: Physician Assistant

## 2017-06-30 ENCOUNTER — Other Ambulatory Visit: Payer: Self-pay

## 2017-06-30 ENCOUNTER — Ambulatory Visit (INDEPENDENT_AMBULATORY_CARE_PROVIDER_SITE_OTHER): Payer: Self-pay | Admitting: Physician Assistant

## 2017-06-30 VITALS — BP 102/58 | HR 84 | Temp 98.6°F | Resp 16 | Ht 63.0 in | Wt 129.6 lb

## 2017-06-30 DIAGNOSIS — R059 Cough, unspecified: Secondary | ICD-10-CM

## 2017-06-30 DIAGNOSIS — R0981 Nasal congestion: Secondary | ICD-10-CM

## 2017-06-30 DIAGNOSIS — R05 Cough: Secondary | ICD-10-CM

## 2017-06-30 DIAGNOSIS — R6889 Other general symptoms and signs: Secondary | ICD-10-CM

## 2017-06-30 MED ORDER — HYDROCODONE-HOMATROPINE 5-1.5 MG/5ML PO SYRP: 5.0000 mL | ORAL_SOLUTION | Freq: Three times a day (TID) | ORAL | 0 refills | Status: AC | PRN

## 2017-06-30 MED ORDER — FLUTICASONE PROPIONATE 50 MCG/ACT NA SUSP: 2.0000 | Freq: Every day | NASAL | 6 refills | Status: AC

## 2017-06-30 MED ORDER — BENZONATATE 100 MG PO CAPS: 100.0000 mg | ORAL_CAPSULE | Freq: Three times a day (TID) | ORAL | 0 refills | Status: AC | PRN

## 2017-06-30 NOTE — Patient Instructions (Addendum)
Start taking Mucinex DM (use coupon) - stay well hydrated while taking this. Drink 2-3 liters of water/day.  Cepacol (coupon) is for sore throat.  Use humidifier in your room at night for sore throat. Take hot shower in the morning to help break up mucus.  Use nasal spray at night before bed - also during the day as needed.    Stay well hydrated. Get lost of rest.   Advil or ibuprofen for pain. Do not take Aspirin.  Drink enough water and fluids to keep your urine clear or pale yellow. For sore throat: ? Gargle with 8 oz of salt water ( tsp of salt per 1 qt of water) as often as every 1-2 hours to soothe your throat.  Gargle liquid benadryl.  Use Elderberry syrup.   For sore throat try using a honey-based tea. Use 3 teaspoons of honey with juice squeezed from half lemon. Place shaved pieces of ginger into 1/2-1 cup of water and warm over stove top. Then mix the ingredients and repeat every 4 hours as needed.  Cough Syrup Recipe: Sweet Lemon & Honey Thyme  Ingredients a handful of fresh thyme sprigs   1 pint of water (2 cups)  1/2 cup honey (raw is best, but regular will do)  1/2 lemon chopped Instructions 1. Place the lemon in the pint jar and cover with the honey. The honey will macerate the lemons and draw out liquids which taste so delicious! 2. Meanwhile, toss the thyme leaves into a saucepan and cover them with the water. 3. Bring the water to a gentle simmer and reduce it to half, about a cup of tea. 4. When the tea is reduced and cooled a bit, strain the sprigs & leaves, add it into the pint jar and stir it well. 5. Give it a shake and use a spoonful as needed. 6. Store your homemade cough syrup in the refrigerator for about a month.   Is there anything I can do on my own to get rid of my cough? Yes. To help get rid of your cough, you can: ?Use a humidifier in your bedroom ?Use an over-the-counter cough medicine, or suck on cough drops or hard candy ?Stop smoking, if you  smoke ?If you have allergies, avoid the things you are allergic to (like pollen, dust, animals, or mold) If you have acid reflux, your doctor or nurse will tell you which lifestyle changes can help reduce symptoms.  Come back if you are not better in 5-7 days.   Thank you for coming in today. I hope you feel we met your needs.  Feel free to call PCP if you have any questions or further requests.  Please consider signing up for MyChart if you do not already have it, as this is a great way to communicate with me.  Best,  Whitney McVey, PA-C   IF you received an x-ray today, you will receive an invoice from [facility]. Please contact [facility] at [phone] with questions or concerns regarding your invoice.   IF you received labwork today, you will receive an invoice from [employer]. Please contact [facility] at [phone] with questions or concerns regarding your invoice.   Our billing staff will not be able to assist you with questions regarding bills from these companies.  You will be contacted with the lab results as soon as they are available. The fastest way to get your results is to activate your My Chart account. Instructions are located on the last  page of this paperwork. If you have not heard from [place] regarding the results in 2 weeks, please contact this office.

## 2017-06-30 NOTE — Progress Notes (Signed)
Dorothy Robinson  MRN: [id number] DOB: 08/11/1995  PCP: Patient, No Pcp Per  Subjective:  Pt is a 21 year old female who presents to clinic for cough x 3 days. Endorses stuffy nose, and sore throat. Cough is keeping her up at night.  She has tried DietitianTheraflu. No known sick contacts. Denies fever ,chills, n/v/d, wheezing, shob, chest tightness.   Review of Systems  Constitutional: Negative for chills, diaphoresis, fatigue and fever.  HENT: Positive for congestion, rhinorrhea and sore throat. Negative for postnasal drip, sinus pressure and sinus pain.   Respiratory: Positive for cough. Negative for shortness of breath and wheezing.   Psychiatric/Behavioral: Negative for sleep disturbance.    There are no active problems to display for this patient.   Current Outpatient Medications on File Prior to Visit  Medication Sig Dispense Refill  . ibuprofen (ADVIL,MOTRIN) 800 MG tablet Take 1 tablet (800 mg total) by mouth every 8 (eight) hours as needed for mild pain or moderate pain. (Patient not taking: Reported on 05/24/2017) 15 tablet 0   No current facility-administered medications on file prior to visit.     Allergies  Allergen Reactions  . Bactrim [Sulfamethoxazole-Trimethoprim]     Cramping, shortness of breath, dizzy, chills, shaking, hard to swallow, eye turn red  . Ceftin [Cefuroxime Axetil]   . Penicillins     Cramping, shortness of breath, dizzy, chills, shaking, hard to swallow  . Septra [Sulfamethoxazole-Trimethoprim]      Objective:  BP (!) 102/58   Pulse 84   Temp 98.6 F (37 C) (Oral)   Resp 16   Ht 5\' 3"  (1.6 m)   Wt 129 lb 9.6 oz (58.8 kg)   LMP 06/12/2017   SpO2 96%   BMI 22.96 kg/m   Physical Exam  Constitutional: She is oriented to person, place, and time and well-developed, well-nourished, and in no distress. No distress.  HENT:  Right Ear: Tympanic membrane normal.  Left Ear: Tympanic membrane normal.  Nose: Mucosal edema present. No  rhinorrhea. Right sinus exhibits no maxillary sinus tenderness and no frontal sinus tenderness. Left sinus exhibits no maxillary sinus tenderness and no frontal sinus tenderness.  Mouth/Throat: Oropharynx is clear and moist and mucous membranes are normal.  Cardiovascular: Normal rate, regular rhythm and normal heart sounds.  Pulmonary/Chest: Effort normal and breath sounds normal. No respiratory distress. She has no wheezes. She has no rales.  Lymphadenopathy:    She has cervical adenopathy.       Right cervical: Deep cervical adenopathy present.       Left cervical: Deep cervical adenopathy present.  Neurological: She is alert and oriented to person, place, and time. GCS score is 15.  Skin: Skin is warm and dry.  Psychiatric: Mood, memory, affect and judgment normal.  Vitals reviewed.   Assessment and Plan :  1. Flu-like symptoms 2. Nasal congestion - fluticasone (FLONASE) 50 MCG/ACT nasal spray; Place 2 sprays into both nostrils daily.  Dispense: 16 g; Refill: 6  3. Cough - HYDROcodone-homatropine (HYCODAN) 5-1.5 MG/5ML syrup; Take 5 mLs by mouth every 8 (eight) hours as needed for cough.  Dispense: 120 mL; Refill: 0 - benzonatate (TESSALON) 100 MG capsule; Take 1-2 capsules (100-200 mg total) by mouth 3 (three) times daily as needed for cough.  Dispense: 40 capsule; Refill: 0 - Pt presents with 3 days of cough and nasal congestion. Suspect viral illness plan to treat supportively. RTC in 5-7 days if no improvement.   Marco CollieWhitney Maliya Marich, PA-C  Primary  Care at [facility] 06/30/2017 2:20 PM

## 2017-10-26 ENCOUNTER — Encounter: Payer: Self-pay | Admitting: Physician Assistant
